# Patient Record
Sex: Male | Born: 1965 | Race: White | Hispanic: No | Marital: Married | Smoking: Former smoker
Health system: Southern US, Community
[De-identification: ages and names within clinical notes are randomized; demographics above are authoritative.]

## PROBLEM LIST (undated history)

## (undated) DIAGNOSIS — E785 Hyperlipidemia, unspecified: Secondary | ICD-10-CM

## (undated) HISTORY — DX: Hyperlipidemia, unspecified: E78.5

---

## 2003-03-14 ENCOUNTER — Ambulatory Visit (HOSPITAL_COMMUNITY): Admission: EM | Admit: 2003-03-14 | Discharge: 2003-03-14 | Payer: Self-pay | Admitting: Emergency Medicine

## 2003-03-14 ENCOUNTER — Encounter: Payer: Self-pay | Admitting: Orthopedic Surgery

## 2012-02-29 ENCOUNTER — Ambulatory Visit (INDEPENDENT_AMBULATORY_CARE_PROVIDER_SITE_OTHER): Payer: BC Managed Care – PPO | Admitting: Physician Assistant

## 2012-02-29 VITALS — BP 124/84 | HR 92 | Temp 98.7°F | Resp 16 | Ht 70.75 in | Wt 259.2 lb

## 2012-02-29 DIAGNOSIS — J029 Acute pharyngitis, unspecified: Secondary | ICD-10-CM

## 2012-02-29 MED ORDER — AMOXICILLIN 875 MG PO TABS: 875.0000 mg | ORAL_TABLET | Freq: Two times a day (BID) | ORAL | Status: AC

## 2012-02-29 NOTE — Progress Notes (Signed)
  Subjective:    Patient ID: Randall Rios, male    DOB: [birth year], 46 y.o.   MRN: [id number]  HPI Patient presents with 3 day history of sore throat. Daughter is currently being treated for strep. She was diagnosed with strep on 7/13 and his throat started hurting 7/16.  He denies nasal congestion, cough, otalgia, fever, chills, nausea, or vomiting. Has not taken any medications yet.      Review of Systems  All other systems reviewed and are negative.       Objective:   Physical Exam  Constitutional: He is oriented to person, place, and time. He appears well-developed and well-nourished.  HENT:  Head: Normocephalic and atraumatic.  Right Ear: Hearing, tympanic membrane, external ear and ear canal normal.  Left Ear: Hearing, tympanic membrane, external ear and ear canal normal.  Mouth/Throat: Uvula is midline and mucous membranes are normal. No oropharyngeal exudate (+tonsillar erythema, no swelling).  Neck: Normal range of motion.  Cardiovascular: Normal rate, regular rhythm and normal heart sounds.   Pulmonary/Chest: Effort normal and breath sounds normal.  Musculoskeletal: Normal range of motion.  Lymphadenopathy:    He has cervical adenopathy (+AC).  Neurological: He is alert and oriented to person, place, and time.  Psychiatric: He has a normal mood and affect. His behavior is normal. Judgment and thought content normal.          Assessment & Plan:   1. Acute pharyngitis  amoxicillin (AMOXIL) 875 MG tablet  Will go ahead and treat for strep throat  Recommend tylenol or ibuprofen as needed for pain.

## 2012-07-18 ENCOUNTER — Ambulatory Visit (INDEPENDENT_AMBULATORY_CARE_PROVIDER_SITE_OTHER): Payer: BC Managed Care – PPO | Admitting: Family Medicine

## 2012-07-18 ENCOUNTER — Ambulatory Visit: Payer: BC Managed Care – PPO

## 2012-07-18 VITALS — BP 118/76 | HR 96 | Temp 98.2°F | Resp 18 | Ht 70.0 in | Wt 257.0 lb

## 2012-07-18 DIAGNOSIS — R059 Cough, unspecified: Secondary | ICD-10-CM

## 2012-07-18 DIAGNOSIS — R05 Cough: Secondary | ICD-10-CM

## 2012-07-18 DIAGNOSIS — J309 Allergic rhinitis, unspecified: Secondary | ICD-10-CM

## 2012-07-18 DIAGNOSIS — J029 Acute pharyngitis, unspecified: Secondary | ICD-10-CM

## 2012-07-18 MED ORDER — AZITHROMYCIN 250 MG PO TABS: ORAL_TABLET | ORAL | Status: DC

## 2012-07-18 MED ORDER — FLUTICASONE PROPIONATE 50 MCG/ACT NA SUSP: 2.0000 | Freq: Every day | NASAL | Status: DC

## 2012-07-18 MED ORDER — HYDROCOD POLST-CHLORPHEN POLST 10-8 MG/5ML PO LQCR: 5.0000 mL | Freq: Two times a day (BID) | ORAL | Status: DC | PRN

## 2012-07-18 NOTE — Progress Notes (Signed)
History and physical exam reviewed in detail.  CXR reviewed.  Agree with below outlined assessment and plan. KMS

## 2012-07-18 NOTE — Progress Notes (Signed)
  Subjective:    Patient ID: Randall Rios, male    DOB: 1965-12-30, 46 y.o.   MRN: [id number]  HPI 46 year old male presents with 2 week history of intermittent sore throat, postnasal drainage, and productive cough.  States that a 3 days ago when he was coughing he "pulled something in his back." since then he has had difficulty sleeping secondary to cough, back pain, and sore throat.      Review of Systems  Constitutional: Negative for fever and chills.  HENT: Positive for sore throat, rhinorrhea and postnasal drip. Negative for congestion and sinus pressure.   Respiratory: Positive for cough. Negative for chest tightness, shortness of breath and wheezing.   Gastrointestinal: Negative for nausea, vomiting and abdominal pain.  Musculoskeletal: Positive for back pain.       Objective:   Physical Exam  Constitutional: He is oriented to person, place, and time. He appears well-developed and well-nourished.  HENT:  Head: Normocephalic and atraumatic.  Right Ear: Hearing, tympanic membrane, external ear and ear canal normal.  Left Ear: Hearing, tympanic membrane, external ear and ear canal normal.  Mouth/Throat: Uvula is midline and mucous membranes are normal. Posterior oropharyngeal erythema present. No oropharyngeal exudate.  Eyes: Conjunctivae normal are normal.  Neck: Normal range of motion.  Cardiovascular: Normal rate, regular rhythm and normal heart sounds.   Pulmonary/Chest: Effort normal and breath sounds normal.  Musculoskeletal:       Thoracic back: Normal.       Back:  Neurological: He is alert and oriented to person, place, and time.  Psychiatric: He has a normal mood and affect. His behavior is normal. Judgment and thought content normal.      [facility] reading (PRIMARY) by  Dr. Katrinka Blazing as normal CXR.      Assessment & Plan:   1. Cough  DG Chest 2 View, azithromycin (ZITHROMAX) 250 MG tablet, chlorpheniramine-HYDROcodone (TUSSIONEX PENNKINETIC ER) 10-8 MG/5ML LQCR    2. Allergic rhinitis  fluticasone (FLONASE) 50 MCG/ACT nasal spray  3. Acute pharyngitis    Will cover with Zpack Tussionex qhs prn cough Ibuprofen or tylenol during the day for pain control Follow up if symptoms worsen or fail to improve.

## 2012-07-21 ENCOUNTER — Telehealth: Payer: Self-pay

## 2012-07-21 MED ORDER — BENZONATATE 100 MG PO CAPS: 100.0000 mg | ORAL_CAPSULE | Freq: Three times a day (TID) | ORAL | Status: DC | PRN

## 2012-07-21 NOTE — Telephone Encounter (Signed)
PATIENT STATES HE SAW HEATHER MARTE FOR PHARYNGITIS. SHE PRESCRIBED HIM A COUGH SYRUP TO HELP HIM SLEEP AT NIGHT. HIS SON ACCIDENTALLY SPILLED THE WHOLE BOTTLE ON THE COUNTER. HE SAID HE WAS NOT ABLE TO TAKE IT LAST NIGHT AND HE COULD NOT SLEEP BECAUSE HIS CHEST HURT. HE SAID HIS COUGH WAS BETTER THOUGH. THE PHARMACIST SAID HE DID NOT HAVE ANY REFILLS SO HE WOULD LIKE TO HAVE IT CALLED INTO THE PHARMACY SO THAT HE CAN CONTINUE TO TAKE IT. BEST PHONE [phone] (CELL)   PHARMACY CHOICE IS [facility].   MBC

## 2012-07-21 NOTE — Telephone Encounter (Signed)
I'm sorry to hear this. I will call in some Tessalon for you and forward the request to [facility].

## 2012-07-21 NOTE — Telephone Encounter (Signed)
Patient should keep controlled medications out of reach from his kids. I am not comfortable prescribing a controlled substance knowing this.

## 2012-07-21 NOTE — Telephone Encounter (Signed)
Spoke with pt and advised message from [place]. Forward to [employer]

## 2012-07-22 NOTE — Telephone Encounter (Signed)
I agree with Randall Rios. Since cough is improving hopefully tessalon will be sufficient. He can also try OTC Delsym or Robitussin.  Please let us know if you are not improving.

## 2012-07-23 NOTE — Telephone Encounter (Signed)
I have spoken to patient to advise, he states he feels better now.

## 2013-10-19 DIAGNOSIS — E785 Hyperlipidemia, unspecified: Secondary | ICD-10-CM | POA: Insufficient documentation

## 2013-10-20 ENCOUNTER — Encounter: Payer: Self-pay | Admitting: Physician Assistant

## 2013-10-20 ENCOUNTER — Ambulatory Visit (INDEPENDENT_AMBULATORY_CARE_PROVIDER_SITE_OTHER): Payer: 59 | Admitting: Physician Assistant

## 2013-10-20 VITALS — BP 138/80 | HR 80 | Temp 97.9°F | Resp 16 | Ht 70.0 in | Wt 238.0 lb

## 2013-10-20 DIAGNOSIS — K649 Unspecified hemorrhoids: Secondary | ICD-10-CM

## 2013-10-20 MED ORDER — TRIAMCINOLONE ACETONIDE 0.1 % EX CREA: 1.0000 "application " | TOPICAL_CREAM | Freq: Two times a day (BID) | CUTANEOUS | Status: DC

## 2013-10-20 MED ORDER — HYDROCORTISONE ACETATE 25 MG RE SUPP: 25.0000 mg | Freq: Two times a day (BID) | RECTAL | Status: DC

## 2013-10-20 NOTE — Patient Instructions (Addendum)
Hemorrhoids Hemorrhoids are swollen veins around the rectum or anus. There are two types of hemorrhoids:   Internal hemorrhoids. These occur in the veins just inside the rectum. They may poke through to the outside and become irritated and painful.  External hemorrhoids. These occur in the veins outside the anus and can be felt as a painful swelling or hard lump near the anus. CAUSES  Pregnancy.   Obesity.   Constipation or diarrhea.   Straining to have a bowel movement.   Sitting for long periods on the toilet.  Heavy lifting or other activity that caused you to strain.  Anal intercourse. SYMPTOMS   Pain.   Anal itching or irritation.   Rectal bleeding.   Fecal leakage.   Anal swelling.   One or more lumps around the anus.  DIAGNOSIS  Your caregiver may be able to diagnose hemorrhoids by visual examination. Other examinations or tests that may be performed include:   Examination of the rectal area with a gloved hand (digital rectal exam).   Examination of anal canal using a small tube (scope).   A blood test if you have lost a significant amount of blood.  A test to look inside the colon (sigmoidoscopy or colonoscopy). TREATMENT Most hemorrhoids can be treated at home. However, if symptoms do not seem to be getting better or if you have a lot of rectal bleeding, your caregiver may perform a procedure to help make the hemorrhoids get smaller or remove them completely. Possible treatments include:   Placing a rubber band at the base of the hemorrhoid to cut off the circulation (rubber band ligation).   Injecting a chemical to shrink the hemorrhoid (sclerotherapy).   Using a tool to burn the hemorrhoid (infrared light therapy).   Surgically removing the hemorrhoid (hemorrhoidectomy).   Stapling the hemorrhoid to block blood flow to the tissue (hemorrhoid stapling).  HOME CARE INSTRUCTIONS   Eat foods with fiber, such as whole grains, beans,  nuts, fruits, and vegetables. Ask your doctor about taking products with added fiber in them (fibersupplements).  Increase fluid intake. Drink enough water and fluids to keep your urine clear or pale yellow.   Exercise regularly.   Go to the bathroom when you have the urge to have a bowel movement. Do not wait.   Avoid straining to have bowel movements.   Keep the anal area dry and clean. Use wet toilet paper or moist towelettes after a bowel movement.   Medicated creams and suppositories may be used or applied as directed.   Only take over-the-counter or prescription medicines as directed by your caregiver.   Take warm sitz baths for 15 20 minutes, 3 4 times a day to ease pain and discomfort.   Place ice packs on the hemorrhoids if they are tender and swollen. Using ice packs between sitz baths may be helpful.   Put ice in a plastic bag.   Place a towel between your skin and the bag.   Leave the ice on for 15 20 minutes, 3 4 times a day.   Do not use a donut-shaped pillow or sit on the toilet for long periods. This increases blood pooling and pain.  SEEK MEDICAL CARE IF:  You have increasing pain and swelling that is not controlled by treatment or medicine.  You have uncontrolled bleeding.  You have difficulty or you are unable to have a bowel movement.  You have pain or inflammation outside the area of the hemorrhoids. MAKE SURE YOU:    Understand these instructions.  Will watch your condition.  Will get help right away if you are not doing well or get worse. Document Released: 07/28/2000 Document Revised: 07/17/2012 Document Reviewed: 06/04/2012 [employer] Patient Information 2014 [employer].  

## 2013-10-20 NOTE — Progress Notes (Signed)
   Subjective:    Patient ID: Randall Rios, male    DOB: 1966/07/05, 48 y.o.   MRN: [id number]  HPI 48 y.o. male noticed it one week ago. Had tenderness, burning, itching and swelling around his anus that he noticed in the shower, no discomfort at that time on tuesday. Then ran 5 miles in Wednesday and had BRBPR afterwards. No painful. Denies constipation, straining, on the toilet for 5-10 mins with BM. His father has a history of colon cancer at age 68, unknown for earliest age of cancer, his is estranged from his father.   Has used OTC and prescription hydrocortisone that has helped.  Review of Systems  Constitutional: Negative.   HENT: Negative.   Respiratory: Negative.   Cardiovascular: Negative.   Gastrointestinal: Positive for blood in stool and anal bleeding. Negative for nausea, vomiting, abdominal pain, diarrhea, constipation, abdominal distention and rectal pain.  Genitourinary: Negative.   Musculoskeletal: Negative.   Neurological: Negative.        Objective:   Physical Exam  Constitutional: He is oriented to person, place, and time. He appears well-developed and well-nourished.  Cardiovascular: Normal rate, regular rhythm and normal heart sounds.   Pulmonary/Chest: Effort normal and breath sounds normal.  Abdominal: Soft. Bowel sounds are normal.  Genitourinary: Prostate normal. Rectal exam shows external hemorrhoid and tenderness. Rectal exam shows no internal hemorrhoid, no fissure, no mass and anal tone normal. Guaiac positive stool.  Neurological: He is alert and oriented to person, place, and time.  Skin: Skin is warm and dry.       Assessment & Plan:   1. Hemorrhoids Sitz baths, etc, if not better we will refer to GI - hydrocortisone (ANUSOL-HC) 25 MG suppository; Place 1 suppository (25 mg total) rectally 2 (two) times daily.  Dispense: 12 suppository; Refill: 0 - triamcinolone cream (KENALOG) 0.1 %; Apply 1 application topically 2 (two) times daily.   Dispense: 85.2 g; Refill: 1

## 2013-11-29 IMAGING — CR DG CHEST 2V
2 series · 2 of 2 positions shown · non-contrast
Comparison: None.

CLINICAL DATA: Cough and shortness of breath

CHEST - 2 VIEW

[PA]
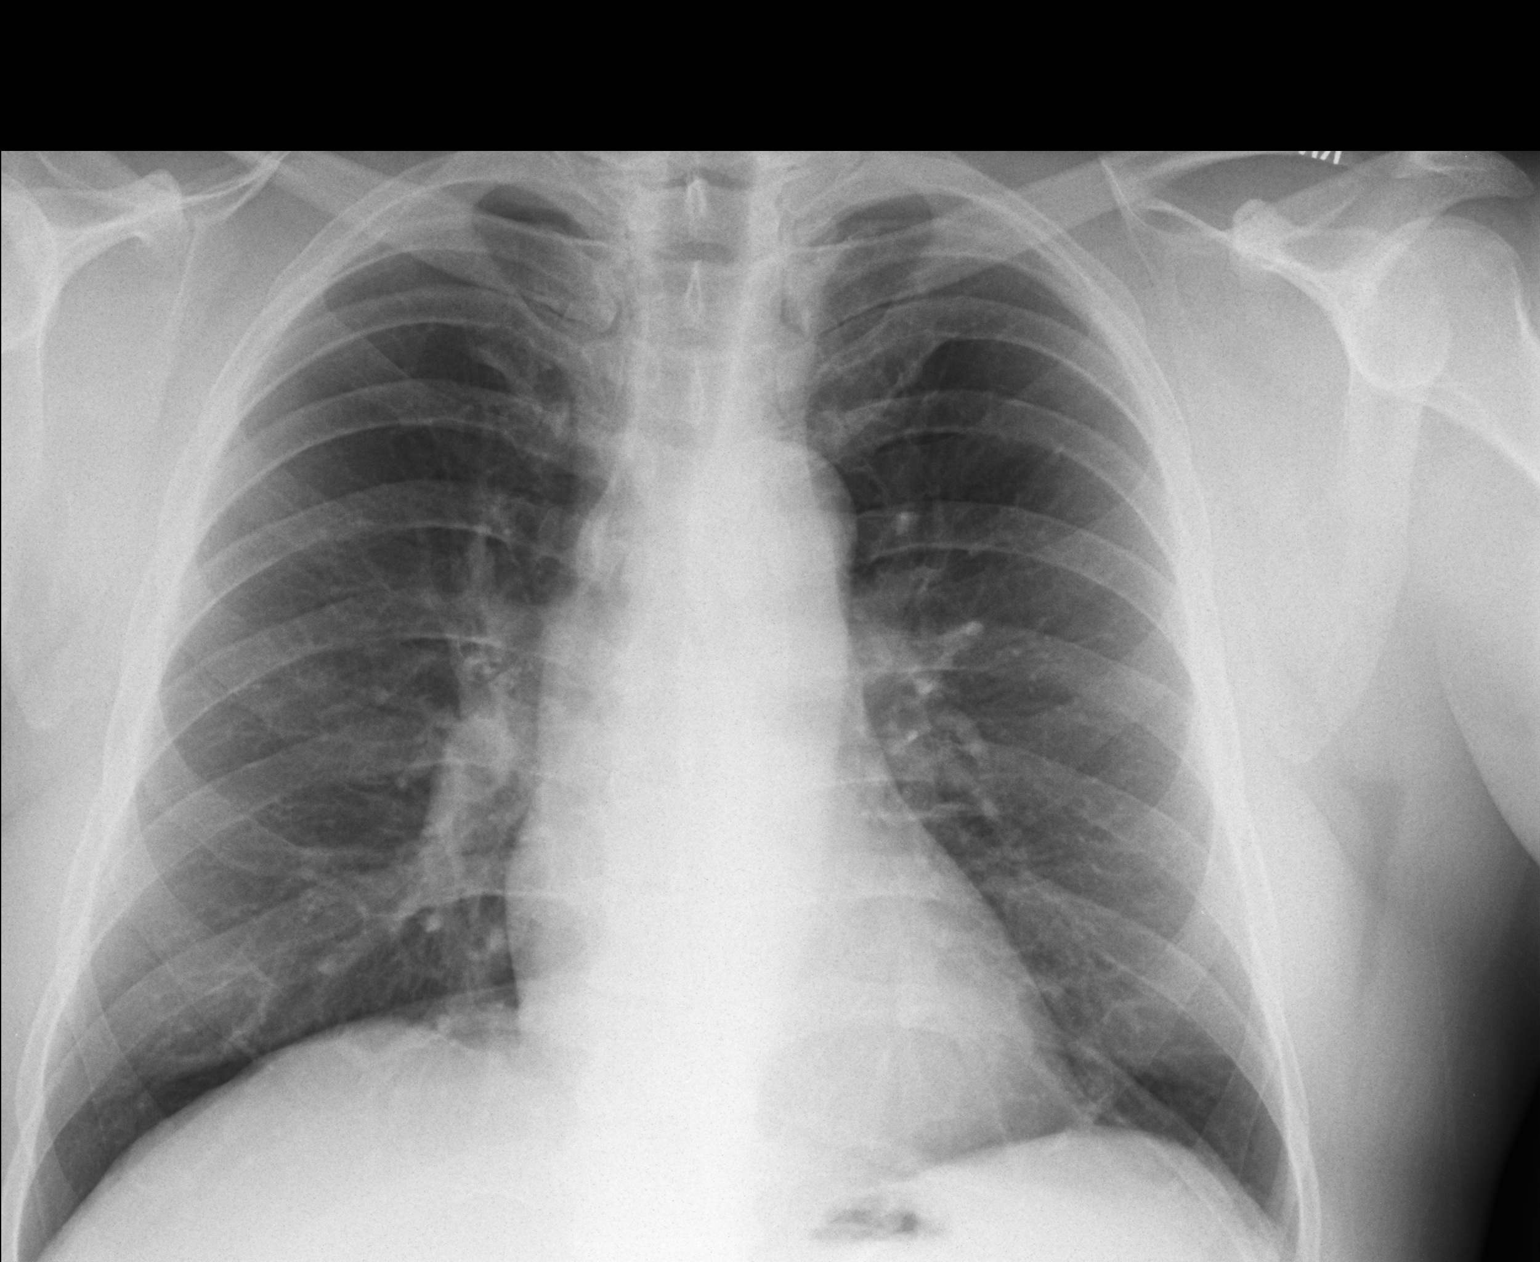

[lateral]
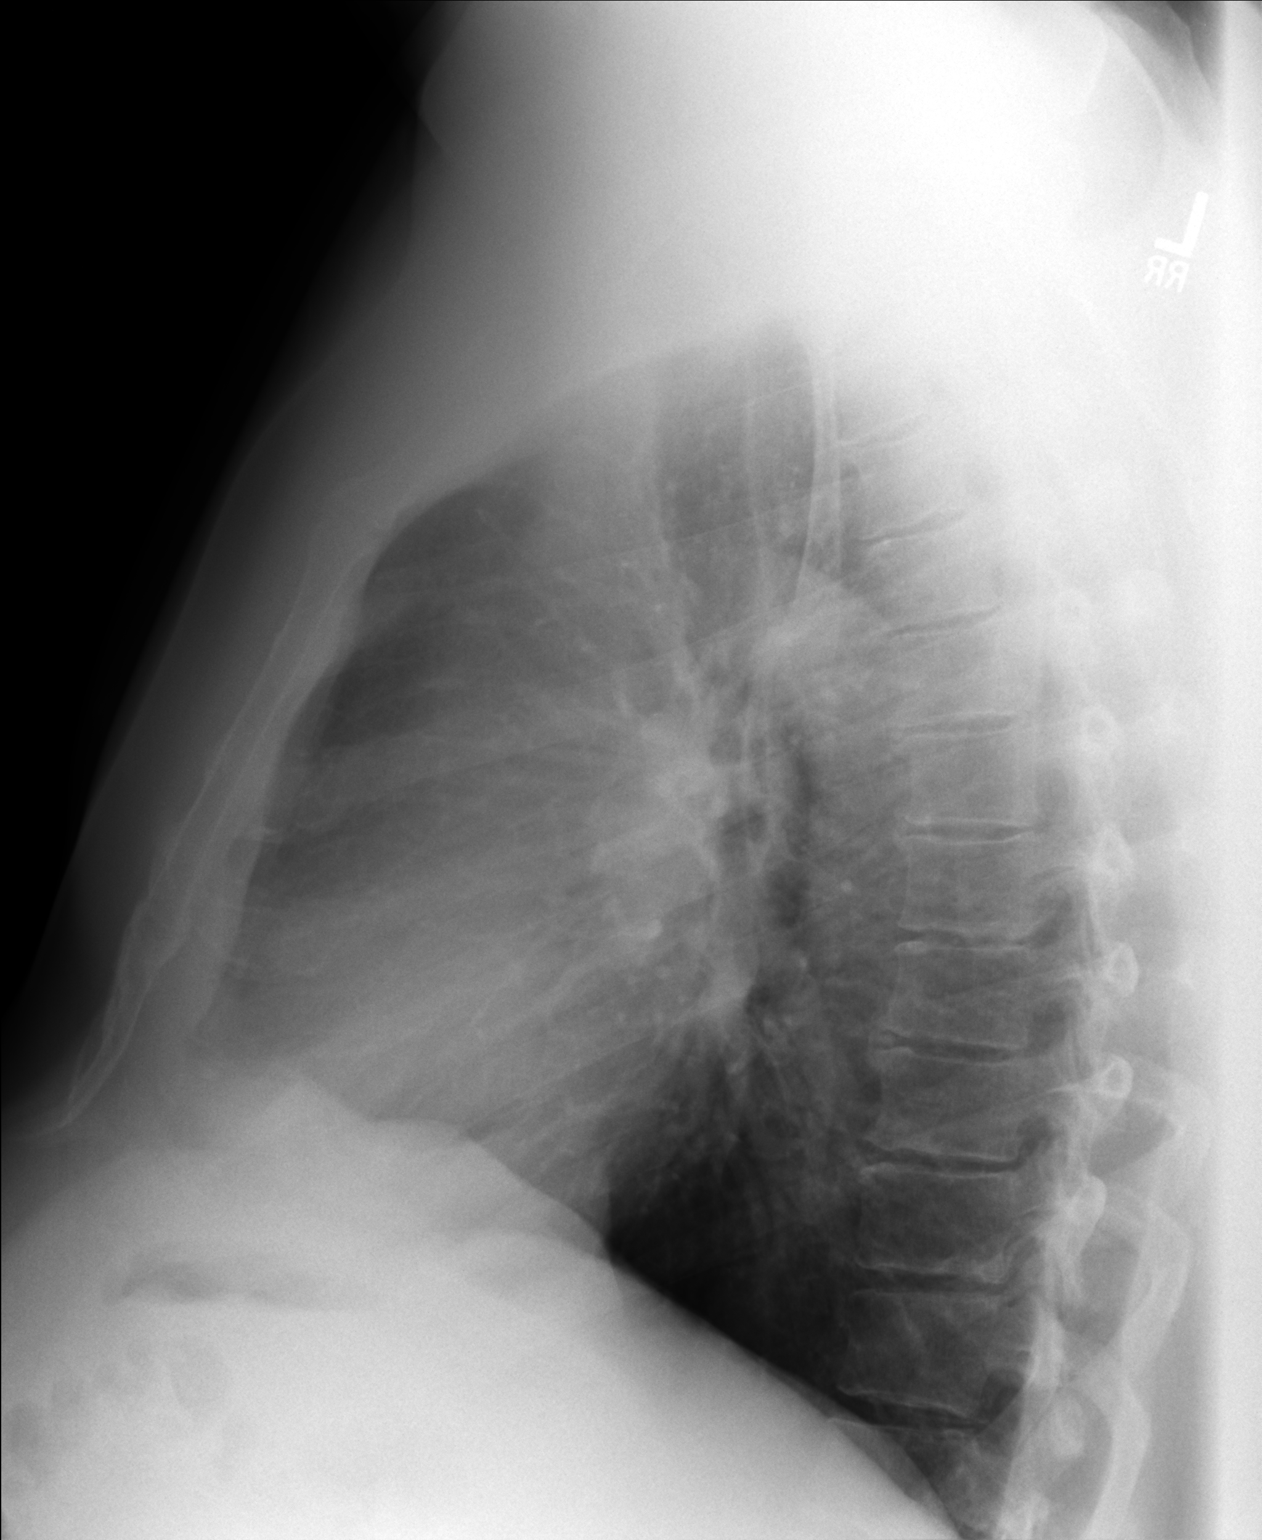

[2 of 2 positions shown; findings below may reference images not displayed]

FINDINGS: Lungs clear.  Heart size and pulmonary vascularity are
normal.  No adenopathy.  No bone lesions.
IMPRESSION: Lungs clear.

## 2014-06-06 ENCOUNTER — Ambulatory Visit (INDEPENDENT_AMBULATORY_CARE_PROVIDER_SITE_OTHER): Payer: 59 | Admitting: Internal Medicine

## 2014-06-06 VITALS — BP 100/80 | HR 92 | Temp 98.7°F | Resp 16 | Ht 71.75 in | Wt 237.0 lb

## 2014-06-06 DIAGNOSIS — J988 Other specified respiratory disorders: Secondary | ICD-10-CM

## 2014-06-06 DIAGNOSIS — J9801 Acute bronchospasm: Secondary | ICD-10-CM

## 2014-06-06 DIAGNOSIS — J22 Unspecified acute lower respiratory infection: Secondary | ICD-10-CM

## 2014-06-06 MED ORDER — AZITHROMYCIN 500 MG PO TABS: 500.0000 mg | ORAL_TABLET | Freq: Every day | ORAL | Status: DC

## 2014-06-06 MED ORDER — HYDROCODONE-HOMATROPINE 5-1.5 MG/5ML PO SYRP: 5.0000 mL | ORAL_SOLUTION | Freq: Four times a day (QID) | ORAL | Status: DC | PRN

## 2014-06-06 MED ORDER — PREDNISONE 20 MG PO TABS: ORAL_TABLET | ORAL | Status: DC

## 2014-06-06 NOTE — Progress Notes (Signed)
Subjective:  This chart was scribed for Randall Lin, MD by Randall Rios, ED Scribe at [facility].The patient was seen in exam room 02 and the patient's care was started at 10:32 AM.  Patient ID: Randall Rios, male    DOB: Jan 25, 1966, 48 y.o.   MRN: [id number]  HPI HPI Comments: Randall Rios is a 48 y.o. male  with a history of HLD who presents to [facility] complaining of cough, fever, HA and generalized body aches. He had a fever of 102 F four days ago. He had a productive cough two days ago which has been keeping him up at night. Pt has CP and SOB as an associated symptoms. He has sick contacts at home, pt notes his daughter has strep throat and is just finishing up her antibiotics. Pt does not have asthma. He does not smoke.  Patient Active Problem List   Diagnosis Date Noted  . Hyperlipidemia    Past Medical History  Diagnosis Date  . Hyperlipidemia    History reviewed. No pertinent past surgical history. No Known Allergies Prior to Admission medications   Medication Sig Start Date End Date Taking? Authorizing Provider  ALPRAZolam Duanne Moron) 0.5 MG tablet Take 0.5 mg by mouth 2 (two) times daily.    Historical Provider, MD  rosuvastatin (CRESTOR) 20 MG tablet Take 20 mg by mouth daily.    Historical Provider, MD   History   Social History  . Marital Status: Married    Spouse Name: N/A    Number of Children: N/A  . Years of Education: N/A   Occupational History  . Not on file.   Social History Main Topics  . Smoking status: Former Smoker    Quit date: 02/28/1989  . Smokeless tobacco: Not on file  . Alcohol Use: No  . Drug Use: No  . Sexual Activity: Yes   Other Topics Concern  . Not on file   Social History Narrative  . No narrative on file   Review of Systems  Constitutional: Positive for fever.  HENT: Positive for sinus pressure, sore throat and trouble swallowing.   Respiratory: Positive for cough, shortness of breath and wheezing.     Cardiovascular: Positive for chest pain.  Musculoskeletal: Positive for myalgias.  Psychiatric/Behavioral: Positive for sleep disturbance.      Objective:   Physical Exam  Nursing note and vitals reviewed. Constitutional: He is oriented to person, place, and time. He appears well-developed and well-nourished.  HENT:  Head: Normocephalic and atraumatic.  Nose, throat clear. No anterior cervical nodes.  Eyes: EOM are normal.  Neck: Normal range of motion.  Cardiovascular: Normal rate, regular rhythm and normal heart sounds.   No murmur heard. Heart sounds normal with no murmurs.  Pulmonary/Chest: Effort normal. He has wheezes.  On the right posterior he has bronchi. Wheezing with forced exertion.  Musculoskeletal: Normal range of motion.  Neurological: He is alert and oriented to person, place, and time.  Skin: Skin is warm and dry.  Psychiatric: He has a normal mood and affect. His behavior is normal.   BP 100/80  Pulse 92  Temp(Src) 98.7 F (37.1 C) (Oral)  Resp 16  Ht 5' 11.75" (1.822 m)  Wt 237 lb (107.502 kg)  BMI 32.38 kg/m2  SpO2 92%     Assessment & Plan:  I personally performed the services described in this documentation, which was scribed in my presence. The recorded information has been reviewed and is accurate. Lower resp. tract  infection  Bronchospasm   Meds ordered this encounter  Medications  . HYDROcodone-homatropine (HYCODAN) 5-1.5 MG/5ML syrup    Sig: Take 5 mLs by mouth every 6 (six) hours as needed.    Dispense:  120 mL    Refill:  0  . azithromycin (ZITHROMAX) 500 MG tablet    Sig: Take 1 tablet (500 mg total) by mouth daily.    Dispense:  5 tablet    Refill:  0  . predniSONE (DELTASONE) 20 MG tablet    Sig: 3/3/2/2/1/1 single daily dose for 6 days    Dispense:  12 tablet    Refill:  0

## 2014-07-08 ENCOUNTER — Encounter (INDEPENDENT_AMBULATORY_CARE_PROVIDER_SITE_OTHER): Payer: 59 | Admitting: Family Medicine

## 2014-07-08 ENCOUNTER — Encounter: Payer: Self-pay | Admitting: Family Medicine

## 2014-07-08 DIAGNOSIS — Z23 Encounter for immunization: Secondary | ICD-10-CM

## 2014-07-11 NOTE — Progress Notes (Signed)
Patient left before being seen    This encounter was created in error - please disregard.

## 2015-04-06 ENCOUNTER — Ambulatory Visit (INDEPENDENT_AMBULATORY_CARE_PROVIDER_SITE_OTHER): Payer: 59 | Admitting: Family Medicine

## 2015-04-06 VITALS — BP 118/84 | HR 79 | Temp 98.5°F | Resp 18 | Ht 71.0 in | Wt 242.8 lb

## 2015-04-06 DIAGNOSIS — J069 Acute upper respiratory infection, unspecified: Secondary | ICD-10-CM | POA: Diagnosis not present

## 2015-04-06 DIAGNOSIS — R059 Cough, unspecified: Secondary | ICD-10-CM

## 2015-04-06 DIAGNOSIS — H9203 Otalgia, bilateral: Secondary | ICD-10-CM | POA: Diagnosis not present

## 2015-04-06 DIAGNOSIS — R05 Cough: Secondary | ICD-10-CM

## 2015-04-06 MED ORDER — HYDROCODONE-HOMATROPINE 5-1.5 MG/5ML PO SYRP: 5.0000 mL | ORAL_SOLUTION | ORAL | Status: AC | PRN

## 2015-04-06 MED ORDER — FLUTICASONE PROPIONATE 50 MCG/ACT NA SUSP
2.0000 | Freq: Every day | NASAL | Status: AC
Start: 2015-04-06 — End: ?

## 2015-04-06 MED ORDER — BENZONATATE 100 MG PO CAPS: 100.0000 mg | ORAL_CAPSULE | Freq: Three times a day (TID) | ORAL | Status: AC | PRN

## 2015-04-06 MED ORDER — AMOXICILLIN 875 MG PO TABS: 875.0000 mg | ORAL_TABLET | Freq: Two times a day (BID) | ORAL | Status: AC

## 2015-04-06 NOTE — Patient Instructions (Signed)
Take amoxicillin one twice daily for infection  Use fluticasone nose spray 2 sprays each nostril twice daily for about 4 days, then once daily for opening up the eustachian tubes  Take an over-the-counter and histamine decongested such as Claritin-D or Allegra-D one daily if necessary to further open up the eustachian tubes for the ears  Take Hycodan cough syrup 1 teaspoon every 4-6 hours as needed when not working or especially at nighttime for cough  Take benzonatate 1 or 2 pills 3 times daily as needed for daytime cough (not significantly sedating)

## 2015-04-06 NOTE — Progress Notes (Signed)
Cough Subjective:  Patient ID: Randall Rios, male    DOB: 13-Feb-1966  Age: 49 y.o. MRN: [id number]  Patient is here with a one-week history of a cough bringing up purulent phlegm. He took some NyQuil last night. He is felt bad today. He's been starting to hurt in both ears. He's been swimming earlier in the summer but not the last few days. He does not smoke. He works at a Network engineer job as a Designer, jewellery for [employer]. He has a wife and 2 adolescent boys. They've not been sick. He has been blowing stuff out of his nose, occasional sneezing. More sore throat this morning on the bad coughing all night.   Objective:   No acute distress. TMs normal. Sinuses nontender. Throat not erythematous. Neck supple without nodes. Chest clear process. Heart regular without murmurs.  Assessment & Plan:   Assessment:  Upper respiratory infection Cough Otalgia  Plan:  Will treat since this is been going on for a week and is bringing up purulent phlegm.  Patient Instructions  Take amoxicillin one twice daily for infection  Use fluticasone nose spray 2 sprays each nostril twice daily for about 4 days, then once daily for opening up the eustachian tubes  Take an over-the-counter and histamine decongested such as Claritin-D or Allegra-D one daily if necessary to further open up the eustachian tubes for the ears  Take Hycodan cough syrup 1 teaspoon every 4-6 hours as needed when not working or especially at nighttime for cough  Take benzonatate 1 or 2 pills 3 times daily as needed for daytime cough (not significantly sedating)    HOPPER,DAVID, MD 04/06/2015

## 2015-07-12 ENCOUNTER — Ambulatory Visit (INDEPENDENT_AMBULATORY_CARE_PROVIDER_SITE_OTHER): Payer: 59 | Admitting: Family Medicine

## 2015-07-12 VITALS — BP 140/88 | HR 68 | Temp 98.1°F | Resp 17 | Ht 71.0 in | Wt 247.8 lb

## 2015-07-12 DIAGNOSIS — Z2839 Other underimmunization status: Secondary | ICD-10-CM

## 2015-07-12 DIAGNOSIS — Z23 Encounter for immunization: Secondary | ICD-10-CM | POA: Diagnosis not present

## 2015-07-12 DIAGNOSIS — Z283 Underimmunization status: Secondary | ICD-10-CM | POA: Diagnosis not present

## 2015-07-12 DIAGNOSIS — T23202A Burn of second degree of left hand, unspecified site, initial encounter: Secondary | ICD-10-CM
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
## 2015-07-12 NOTE — Patient Instructions (Signed)
Dress with silvadene as discussed.  Return if any concern.

## 2016-09-29 DIAGNOSIS — H9209 Otalgia, unspecified ear: Secondary | ICD-10-CM | POA: Diagnosis not present

## 2016-09-30 ENCOUNTER — Encounter (HOSPITAL_COMMUNITY): Payer: Self-pay | Admitting: Emergency Medicine

## 2016-09-30 ENCOUNTER — Emergency Department (HOSPITAL_COMMUNITY)
Admission: EM | Admit: 2016-09-30 | Discharge: 2016-09-30 | Disposition: A | Discharge status: Home / Self Care | Payer: 59 | Attending: Emergency Medicine | Admitting: Emergency Medicine

## 2016-09-30 DIAGNOSIS — Y92000 Kitchen of unspecified non-institutional (private) residence as  the place of occurrence of the external cause: Secondary | ICD-10-CM | POA: Insufficient documentation

## 2016-09-30 DIAGNOSIS — S61211A Laceration without foreign body of left index finger without damage to nail, initial encounter: Secondary | ICD-10-CM | POA: Insufficient documentation

## 2016-09-30 DIAGNOSIS — W260XXA Contact with knife, initial encounter: Secondary | ICD-10-CM | POA: Diagnosis not present

## 2016-09-30 DIAGNOSIS — Y999 Unspecified external cause status: Secondary | ICD-10-CM | POA: Insufficient documentation

## 2016-09-30 DIAGNOSIS — Z79899 Other long term (current) drug therapy: Secondary | ICD-10-CM | POA: Insufficient documentation

## 2016-09-30 DIAGNOSIS — Z87891 Personal history of nicotine dependence: Secondary | ICD-10-CM | POA: Diagnosis not present

## 2016-09-30 DIAGNOSIS — Y939 Activity, unspecified: Secondary | ICD-10-CM | POA: Diagnosis not present

## 2016-09-30 NOTE — ED Provider Notes (Signed)
[facility] DEPT Provider Note   CSN: [id number] Arrival date & time: 09/30/16  [id number]  By signing my name below, I, Dora Sims, attest that this documentation has been prepared under the direction and in the presence of Shary Decamp, PA-C. Electronically Signed: Dora Sims, Scribe. 09/30/2016. 7:35 PM.  History   Chief Complaint Chief Complaint  Patient presents with  . Laceration    The history is provided by the patient. No language interpreter was used.     HPI Comments: Randall Rios is a 51 y.o. male who presents to the Emergency Department complaining of a left index finger laceration sustained while using a kitchen knife shortly PTA. He states he bled profusely from the wound site initially but the bleeding is now controlled with applied gauze. Tetanus status UTD within the last 5 years. No anticoagulants. He denies numbness/tingling, neuro deficits, or any other associated symptoms.  Past Medical History:  Diagnosis Date  . Hyperlipidemia     Patient Active Problem List   Diagnosis Date Noted  . Hyperlipidemia     History reviewed. No pertinent surgical history.     Home Medications    Prior to Admission medications   Medication Sig Start Date End Date Taking? Authorizing Provider  amoxicillin (AMOXIL) 875 MG tablet Take 1 tablet (875 mg total) by mouth 2 (two) times daily. Patient not taking: Reported on 07/12/2015 04/06/15   Posey Boyer, MD  benzonatate (TESSALON) 100 MG capsule Take 1-2 capsules (100-200 mg total) by mouth 3 (three) times daily as needed. Patient not taking: Reported on 07/12/2015 04/06/15   Posey Boyer, MD  fluticasone [facility]) 50 MCG/ACT nasal spray Place 2 sprays into both nostrils daily. Patient not taking: Reported on 07/12/2015 04/06/15   Posey Boyer, MD  HYDROcodone-homatropine [facility]) 5-1.5 MG/5ML syrup Take 5 mLs by mouth every 4 (four) hours as needed. Patient not taking: Reported on 07/12/2015 04/06/15   Posey Boyer, MD    Family History No family history on file.  Social History Social History  Substance Use Topics  . Smoking status: Former Smoker    Quit date: 02/28/1989  . Smokeless tobacco: Not on file  . Alcohol use No     Allergies   Patient has no known allergies.   Review of Systems Review of Systems  Skin: Positive for wound.  Neurological: Negative for numbness.  Hematological: Does not bruise/bleed easily.     Physical Exam Updated Vital Signs BP 119/92 (BP Location: Right Arm)   Pulse 98   Temp 98.3 F (36.8 C) (Oral)   Resp 18   Wt 240 lb (108.9 kg)   SpO2 98%   BMI 33.47 kg/m   Physical Exam  Constitutional: He is oriented to person, place, and time. He appears well-developed and well-nourished. No distress.  HENT:  Head: Normocephalic and atraumatic.  Eyes: Conjunctivae and EOM are normal.  Neck: Neck supple. No tracheal deviation present.  Cardiovascular: Normal rate.   Pulmonary/Chest: Effort normal. No respiratory distress.  Musculoskeletal: Normal range of motion.  Neurological: He is alert and oriented to person, place, and time.  Skin: Skin is warm and dry. Laceration noted.  1 cm superficial linear laceration to lateral aspect of left index finger. No bleeding noted.  Psychiatric: He has a normal mood and affect. His behavior is normal.  Nursing note and vitals reviewed.  ED Treatments / Results  Labs (all labs ordered are listed, but only abnormal results are displayed) Labs Reviewed - No  data to display  EKG  EKG Interpretation None       Radiology No results found.  Procedures .Marland KitchenLaceration Repair Date/Time: 09/30/2016 7:40 PM Performed by: Shary Decamp Authorized by: Shary Decamp   Consent:    Consent obtained:  Verbal   Consent given by:  Patient Anesthesia (see MAR for exact dosages):    Anesthesia method:  None Laceration details:    Location:  Finger   Finger location:  L index finger   Length (cm):  1 Repair  type:    Repair type:  Simple Skin repair:    Repair method:  Tissue adhesive Approximation:    Approximation:  Close   Vermilion border: well-aligned   Post-procedure details:    Patient tolerance of procedure:  Tolerated well, no immediate complications   (including critical care time)  DIAGNOSTIC STUDIES: Oxygen Saturation is *98% on RA, normal by my interpretation.    COORDINATION OF CARE: 7:40 PM Discussed treatment plan with pt at bedside and pt agreed to plan.  Medications Ordered in ED Medications - No data to display   Initial Impression / Assessment and Plan / ED Course  I have reviewed the triage vital signs and the nursing notes.  Pertinent labs & imaging results that were available during my care of the patient were reviewed by me and considered in my medical decision making (see chart for details).  Final Clinical Impressions(s) / ED Diagnoses  I have reviewed the relevant previous healthcare records. I obtained HPI from historian.  ED Course:  Assessment: Patient is a 50yM that presents with laceration to left index finger from kitchen knife. Tdap UTD. Pressure irrigation performed. Bottom of the wound visualized with bleeding controled. Laceration occurred < 8 hours prior to repair which was well tolerated. Pt has no co morbidities to effect normal wound healing. Dermabond used with close approximation. Pt is hemodynamically stable w no complaints prior to dc.    Disposition/Plan:  DC Home Additional Verbal discharge instructions given and discussed with patient.  Pt Instructed to f/u with PCP in the next week for evaluation and treatment of symptoms. Return precautions given Pt acknowledges and agrees with plan  Supervising Physician Fatima Blank, MD  Final diagnoses:  Laceration of left index finger without foreign body without damage to nail, initial encounter    New Prescriptions New Prescriptions   No medications on file   I personally  performed the services described in this documentation, which was scribed in my presence. The recorded information has been reviewed and is accurate.    Shary Decamp, PA-C 09/30/16 1952    Fatima Blank, MD 10/01/16 [phone]

## 2016-09-30 NOTE — Discharge Instructions (Signed)
Please read and follow all provided instructions.  Your diagnoses today include:  1. Laceration of left index finger without foreign body without damage to nail, initial encounter    Tests performed today include:  Vital signs. See below for your results today.   Medications prescribed:   Take as prescribed   Home care instructions:  Follow any educational materials contained in this packet.  Follow-up instructions: Please follow-up with your primary care provider for further evaluation of symptoms and treatment   Return instructions:   Please return to the Emergency Department if you do not get better, if you get worse, or new symptoms OR  - Fever (temperature greater than 101.7F)  - Bleeding that does not stop with holding pressure to the area    -Severe pain (please note that you may be more sore the day after your accident)  - Chest Pain  - Difficulty breathing  - Severe nausea or vomiting  - Inability to tolerate food and liquids  - Passing out  - Skin becoming red around your wounds  - Change in mental status (confusion or lethargy)  - New numbness or weakness     Please return if you have any other emergent concerns.  Additional Information:  Your vital signs today were: BP 119/92 (BP Location: Right Arm)    Pulse 98    Temp 98.3 F (36.8 C) (Oral)    Resp 18    Wt 108.9 kg    SpO2 98%    BMI 33.47 kg/m  If your blood pressure (BP) was elevated above 135/85 this visit, please have this repeated by your doctor within one month. ---------------

## 2016-09-30 NOTE — ED Triage Notes (Signed)
Pt complaint of left index finger laceration by knife in kitchen; event 45 minutes ago. Pt verbalizes TDAP has been within past 5 years.

## 2017-05-11 DIAGNOSIS — I998 Other disorder of circulatory system: Secondary | ICD-10-CM | POA: Diagnosis not present

## 2017-05-11 DIAGNOSIS — S20211A Contusion of right front wall of thorax, initial encounter: Secondary | ICD-10-CM | POA: Diagnosis not present

## 2017-05-15 DIAGNOSIS — Z23 Encounter for immunization: Secondary | ICD-10-CM | POA: Diagnosis not present

## 2017-06-26 DIAGNOSIS — Z136 Encounter for screening for cardiovascular disorders: Secondary | ICD-10-CM | POA: Diagnosis not present

## 2017-06-26 DIAGNOSIS — Z0000 Encounter for general adult medical examination without abnormal findings: Secondary | ICD-10-CM | POA: Diagnosis not present

## 2018-01-01 DIAGNOSIS — Z79899 Other long term (current) drug therapy: Secondary | ICD-10-CM | POA: Diagnosis not present

## 2018-01-01 DIAGNOSIS — E785 Hyperlipidemia, unspecified: Secondary | ICD-10-CM | POA: Diagnosis not present

## 2018-01-01 DIAGNOSIS — R7301 Impaired fasting glucose: Secondary | ICD-10-CM | POA: Diagnosis not present

## 2018-02-04 DIAGNOSIS — D126 Benign neoplasm of colon, unspecified: Secondary | ICD-10-CM | POA: Diagnosis not present

## 2018-02-04 DIAGNOSIS — Z8371 Family history of colonic polyps: Secondary | ICD-10-CM | POA: Diagnosis not present

## 2018-02-04 DIAGNOSIS — Z1211 Encounter for screening for malignant neoplasm of colon: Secondary | ICD-10-CM | POA: Diagnosis not present

## 2018-05-25 DIAGNOSIS — Z23 Encounter for immunization: Secondary | ICD-10-CM | POA: Diagnosis not present

## 2018-06-27 DIAGNOSIS — Z0000 Encounter for general adult medical examination without abnormal findings: Secondary | ICD-10-CM | POA: Diagnosis not present

## 2018-06-27 DIAGNOSIS — E785 Hyperlipidemia, unspecified: Secondary | ICD-10-CM | POA: Diagnosis not present

## 2019-06-19 ENCOUNTER — Other Ambulatory Visit: Payer: Self-pay

## 2019-06-19 DIAGNOSIS — Z20822 Contact with and (suspected) exposure to covid-19: Secondary | ICD-10-CM

## 2019-06-21 LAB — NOVEL CORONAVIRUS, NAA: SARS-CoV-2, NAA: NOT DETECTED

## 2019-09-05 ENCOUNTER — Other Ambulatory Visit: Payer: 59

## 2019-09-05 ENCOUNTER — Ambulatory Visit: Payer: 59 | Attending: Internal Medicine

## 2019-09-09 ENCOUNTER — Other Ambulatory Visit: Payer: 59

## 2021-10-31 ENCOUNTER — Other Ambulatory Visit: Payer: Self-pay | Admitting: Family Medicine

## 2021-10-31 DIAGNOSIS — N5082 Scrotal pain: Secondary | ICD-10-CM

## 2021-11-10 ENCOUNTER — Ambulatory Visit
Admission: RE | Admit: 2021-11-10 | Discharge: 2021-11-10 | Disposition: A | Discharge status: Home / Self Care | Payer: 59 | Source: Ambulatory Visit | Attending: Family Medicine | Admitting: Family Medicine

## 2021-11-10 DIAGNOSIS — N5082 Scrotal pain: Secondary | ICD-10-CM

## 2023-03-24 IMAGING — US US SCROTUM W/ DOPPLER COMPLETE
2 series · 13 of 25 positions shown · non-contrast
Comparison: None

CLINICAL DATA: RIGHT scrotal pain for 1 year

EXAM:
SCROTAL ULTRASOUND
DOPPLER ULTRASOUND OF THE TESTICLES
TECHNIQUE: Complete ultrasound examination of the testicles, epididymis, and
other scrotal structures was performed. Color and spectral Doppler
ultrasound were also utilized to evaluate blood flow to the
testicles.

[Series 1: us scrotum w/ doppler complete · 0.06mm/px · 12 of 40 slices shown (1 of 2)]
[im 1/40]
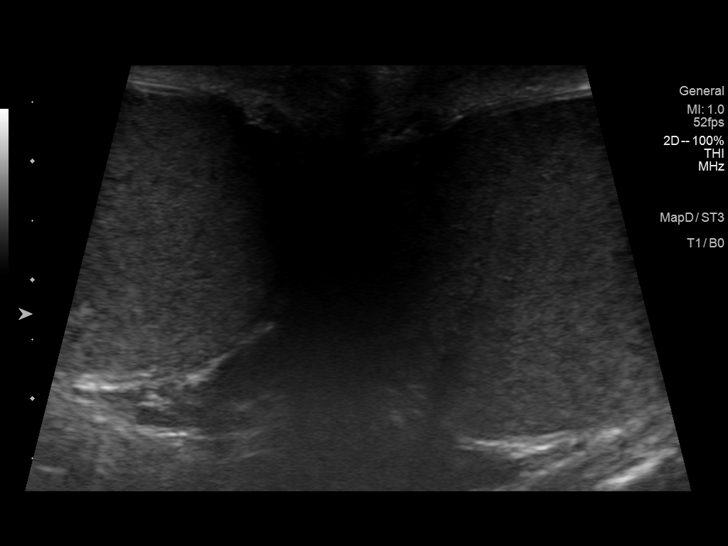
[im 4/40]
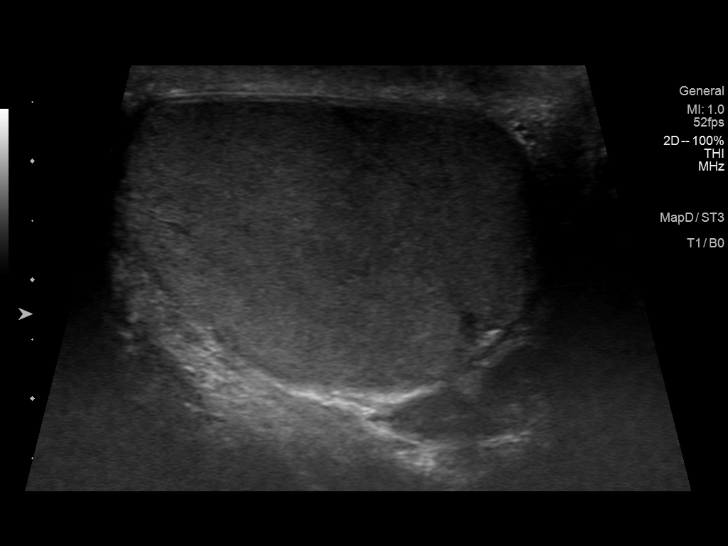
[im 8/40]
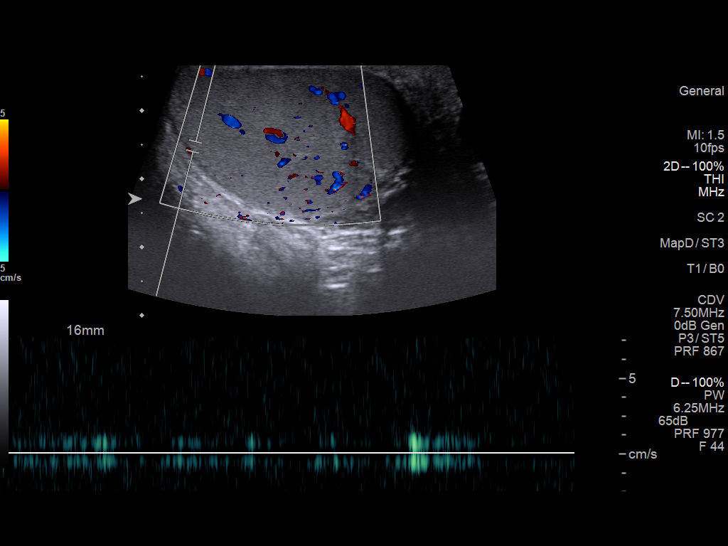
[im 11/40]
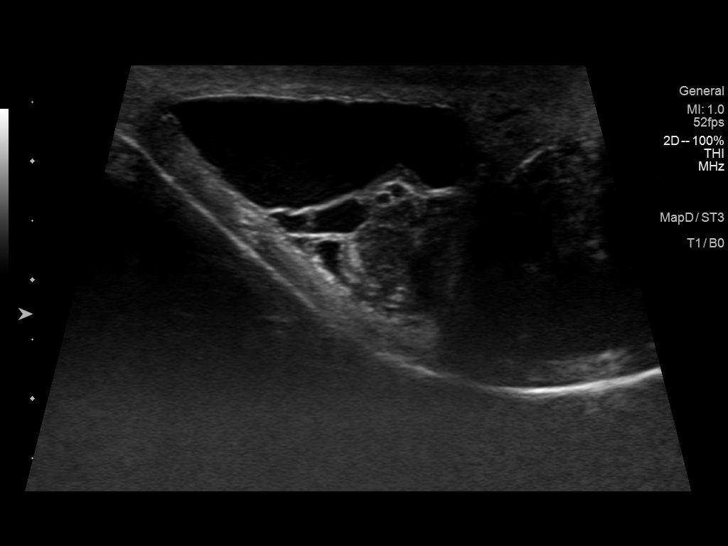
[im 15/40]
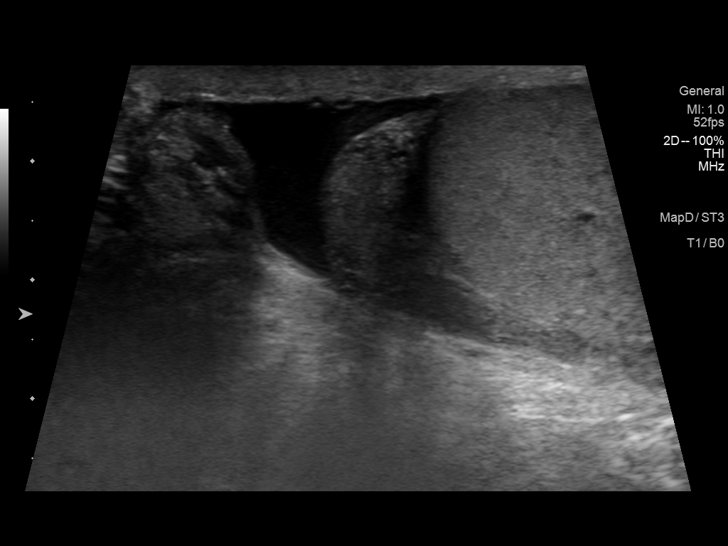
[im 18/40]
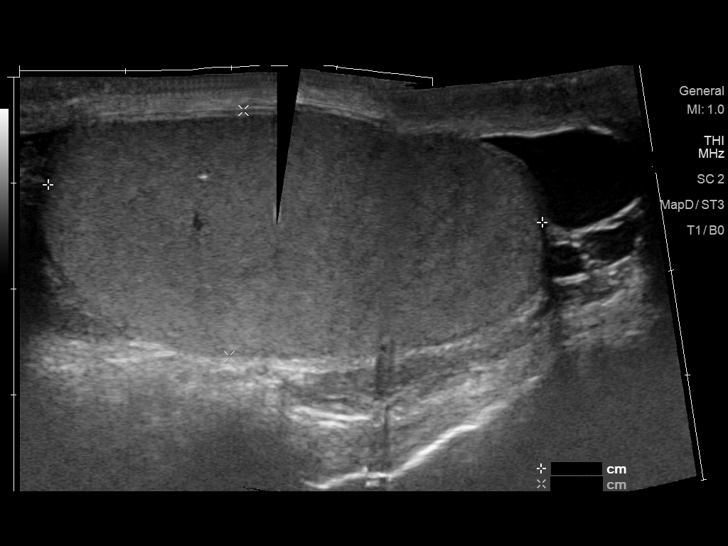
[im 22/40]
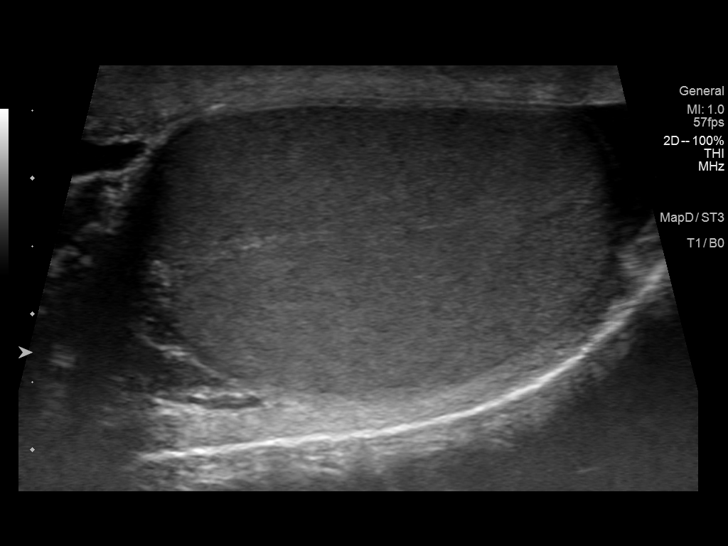
[im 25/40]
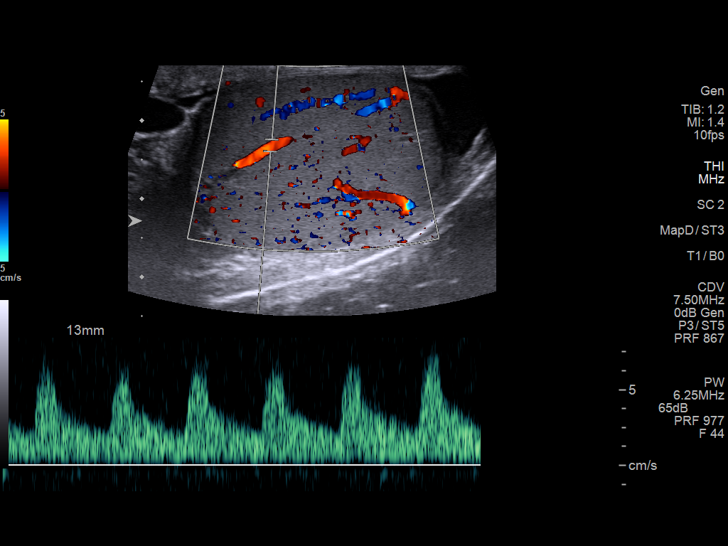
[im 29/40]
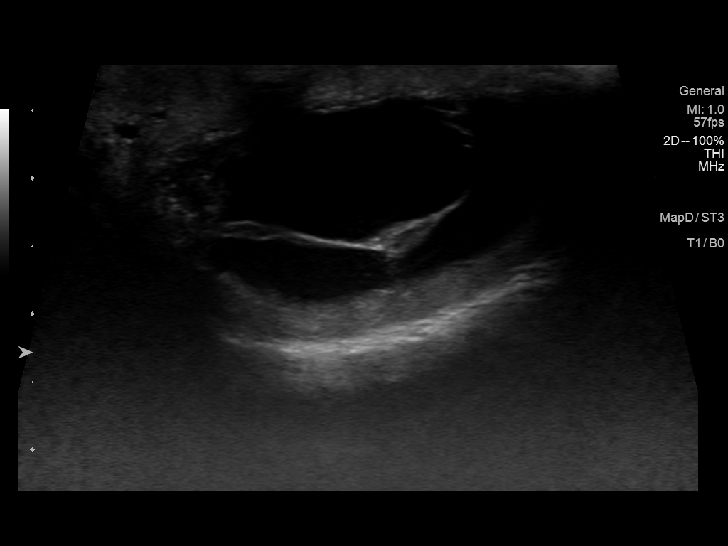
[im 32/40]
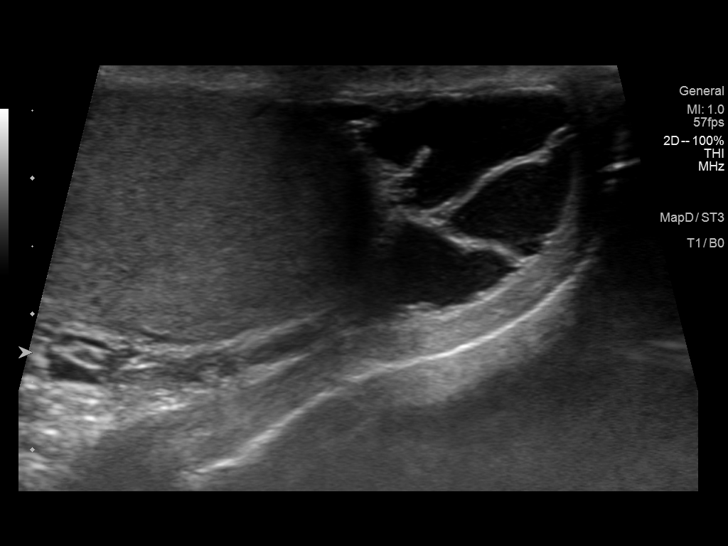
[im 36/40]
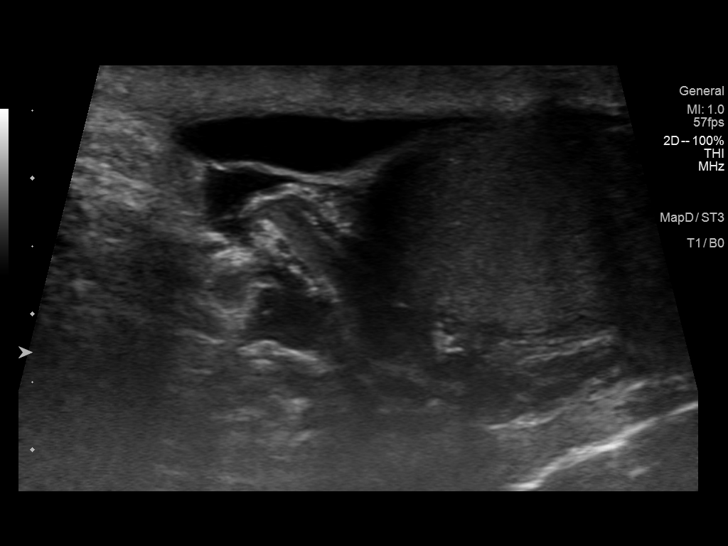
[im 40/40]
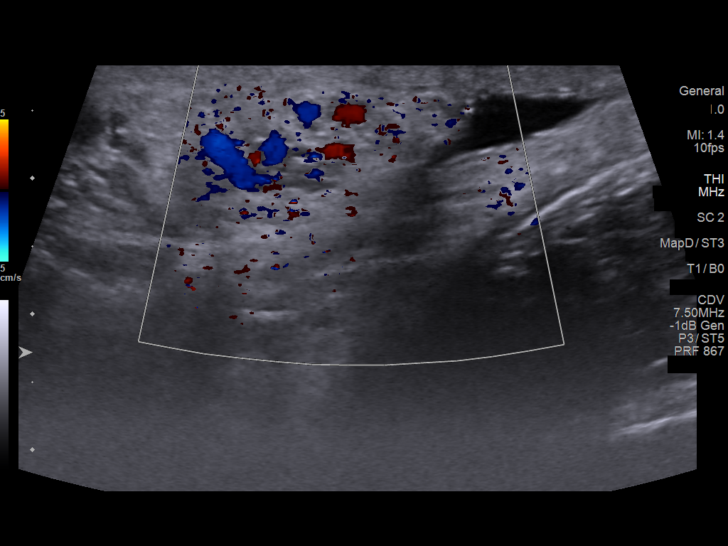

[Series 2: us scrotum w/ doppler complete · 0.09mm/px · 1 of 3 slices shown (2 of 2)]
[im 3/3]
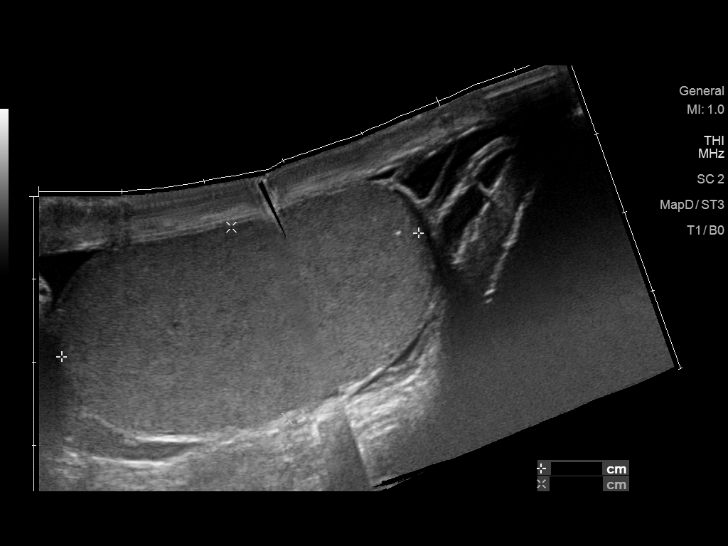

[13 of 25 positions shown; findings below may reference images not displayed]

FINDINGS: Right testicle

Measurements: 4.7 x 2.3 x 3.5 cm. Normal echogenicity without mass.
Single nonspecific microcalcification. Blood flow present within
RIGHT testis on color Doppler imaging.

Left testicle

Measurements: 4.6 x 2.4 x 3.3 cm. Normal echogenicity without mass
or calcification. Blood flow present within LEFT testis on color
Doppler imaging.

Right epididymis:  Normal in size and appearance.

Left epididymis:  Normal in size and appearance.

Hydrocele: Small BILATERAL hydroceles, partially septated
bilaterally.

Varicocele:  RIGHT varicocele present.

Pulsed Doppler interrogation of both testes demonstrates normal low
resistance arterial and venous waveforms bilaterally.
IMPRESSION: Septated mildly complicated appearing hydroceles bilaterally.

Normal appearing testes and epididymi.

RIGHT varicocele.
